# Patient Record
Sex: Male | Born: 1960 | Race: Black or African American | Hispanic: No | Marital: Single | Smoking: Current every day smoker
Health system: Southern US, Community
[De-identification: ages and names within clinical notes are randomized; demographics above are authoritative.]

## PROBLEM LIST (undated history)

## (undated) DIAGNOSIS — I10 Essential (primary) hypertension: Secondary | ICD-10-CM

## (undated) DIAGNOSIS — R519 Headache, unspecified: Secondary | ICD-10-CM

## (undated) DIAGNOSIS — R112 Nausea with vomiting, unspecified: Secondary | ICD-10-CM

## (undated) DIAGNOSIS — Z9889 Other specified postprocedural states: Secondary | ICD-10-CM

## (undated) DIAGNOSIS — Z8489 Family history of other specified conditions: Secondary | ICD-10-CM

## (undated) DIAGNOSIS — F410 Panic disorder [episodic paroxysmal anxiety] without agoraphobia: Secondary | ICD-10-CM

## (undated) DIAGNOSIS — M1990 Unspecified osteoarthritis, unspecified site: Secondary | ICD-10-CM

## (undated) HISTORY — PX: NO PAST SURGERIES: SHX2092

---

## 2008-04-22 ENCOUNTER — Emergency Department (HOSPITAL_COMMUNITY): Admission: EM | Admit: 2008-04-22 | Discharge: 2008-04-22 | Payer: Self-pay | Admitting: Emergency Medicine

## 2009-01-07 ENCOUNTER — Emergency Department (HOSPITAL_COMMUNITY): Admission: EM | Admit: 2009-01-07 | Discharge: 2009-01-08 | Payer: Self-pay | Admitting: Emergency Medicine

## 2009-10-18 ENCOUNTER — Emergency Department (HOSPITAL_COMMUNITY): Admission: EM | Admit: 2009-10-18 | Discharge: 2009-10-18 | Payer: Self-pay | Admitting: Emergency Medicine

## 2019-10-30 ENCOUNTER — Encounter (HOSPITAL_COMMUNITY): Payer: Self-pay

## 2019-10-30 ENCOUNTER — Other Ambulatory Visit: Payer: Self-pay

## 2019-10-30 ENCOUNTER — Ambulatory Visit (HOSPITAL_COMMUNITY)
Admission: EM | Admit: 2019-10-30 | Discharge: 2019-10-30 | Disposition: A | Discharge status: Home / Self Care | Payer: 59

## 2019-10-30 DIAGNOSIS — J36 Peritonsillar abscess: Secondary | ICD-10-CM

## 2019-10-30 DIAGNOSIS — J029 Acute pharyngitis, unspecified: Secondary | ICD-10-CM

## 2019-10-30 LAB — POC SARS CORONAVIRUS 2 AG -  ED
SARS Coronavirus 2 Ag: NEGATIVE
SARS Coronavirus 2 Ag: NEGATIVE

## 2019-10-30 LAB — POC SARS CORONAVIRUS 2 AG: SARS Coronavirus 2 Ag: NEGATIVE

## 2019-10-30 MED ORDER — LIDOCAINE HCL (PF) 1 % IJ SOLN
INTRAMUSCULAR | Status: AC
  Filled 2019-10-30: qty 2

## 2019-10-30 MED ORDER — KETOROLAC TROMETHAMINE 30 MG/ML IJ SOLN
30.0000 mg | Freq: Once | INTRAMUSCULAR | Status: AC
  Administered 2019-10-30: 30 mg via INTRAMUSCULAR

## 2019-10-30 MED ORDER — DEXAMETHASONE SODIUM PHOSPHATE 10 MG/ML IJ SOLN
10.0000 mg | Freq: Once | INTRAMUSCULAR | Status: AC
  Administered 2019-10-30: 15:00:00 10 mg via INTRAMUSCULAR

## 2019-10-30 MED ORDER — DEXAMETHASONE SODIUM PHOSPHATE 10 MG/ML IJ SOLN
INTRAMUSCULAR | Status: AC
  Filled 2019-10-30: qty 1

## 2019-10-30 MED ORDER — KETOROLAC TROMETHAMINE 30 MG/ML IJ SOLN
INTRAMUSCULAR | Status: AC
  Filled 2019-10-30: qty 1

## 2019-10-30 MED ORDER — CEFTRIAXONE SODIUM 1 G IJ SOLR
INTRAMUSCULAR | Status: AC
  Filled 2019-10-30: qty 10

## 2019-10-30 MED ORDER — AMOXICILLIN-POT CLAVULANATE 875-125 MG PO TABS: 1.0000 | ORAL_TABLET | Freq: Two times a day (BID) | ORAL | 0 refills | Status: DC

## 2019-10-30 MED ORDER — CEFTRIAXONE SODIUM 1 G IJ SOLR
1.0000 g | Freq: Once | INTRAMUSCULAR | Status: AC
Start: 2019-10-30 — End: 2019-10-30
  Administered 2019-10-30: 1 g via INTRAMUSCULAR

## 2019-10-30 NOTE — ED Provider Notes (Signed)
[place]    CSN: [id number] Arrival date & time: 10/30/19  1235      History   Chief Complaint Chief Complaint  Patient presents with  . Sore Throat    HPI Javier Bates is a 59 y.o. male.   Patient is a 59 year old male with no significant past medical history.  He presents today complaining of sore throat, pain with swallowing and difficulty swallowing.  This has been constant and worsening over the past 3 days.  He has also had some chills, nausea and loss of appetite.  Reporting he has not eaten in 2 days and has been taking Advil, Tylenol which he believes is upsetting his stomach.  Denies any abdominal pain, vomiting or diarrhea.  No recorded fevers at home.  He is able to drink fluids without any difficulty.  He is maintaining his own secretions.  ROS per HPI      History reviewed. No pertinent past medical history.  There are no problems to display for this patient.   History reviewed. No pertinent surgical history.     Home Medications    Prior to Admission medications   Medication Sig Start Date End Date Taking? Authorizing Provider  ibuprofen (ADVIL) 100 MG/5ML suspension Take 600 mg by mouth every 4 (four) hours as needed.   Yes [provider]  naproxen sodium (ALEVE) 220 MG tablet Take 220 mg by mouth.   Yes [provider]  amoxicillin-clavulanate (AUGMENTIN) 875-125 MG tablet Take 1 tablet by mouth every 12 (twelve) hours. 10/30/19   Orvan July, NP    Family History Family History  Problem Relation Age of Onset  . Asthma Mother   . Hypertension Mother   . Asthma Father     Social History Social History   Tobacco Use  . Smoking status: Current Every Day Smoker    Packs/day: 1.00    Types: Cigarettes  . Smokeless tobacco: Never Used  Substance Use Topics  . Alcohol use: Yes  . Drug use: Yes    Types: Marijuana     Allergies   Patient has no known allergies.   Review of Systems Review of  Systems   Physical Exam Triage Vital Signs ED Triage Vitals  Enc Vitals Group     BP 10/30/19 1317 (!) 155/118     Pulse Rate 10/30/19 1317 (!) 112     Resp 10/30/19 1317 20     Temp 10/30/19 1317 97.7 F (36.5 C)     Temp Source 10/30/19 1317 Oral     SpO2 10/30/19 1317 98 %     Weight --      Height --      Head Circumference --      Peak Flow --      Pain Score 10/30/19 1314 9     Pain Loc --      Pain Edu? --      Excl. in [place]? --    No data found.  Updated Vital Signs BP (!) 155/118 (BP Location: Right Arm)   Pulse (!) 112   Temp 97.7 F (36.5 C) (Oral)   Resp 20   SpO2 98%   Visual Acuity Right Eye Distance:   Left Eye Distance:   Bilateral Distance:    Right Eye Near:   Left Eye Near:    Bilateral Near:     Physical Exam Vitals and nursing note reviewed.  Constitutional:      General: He is not in  acute distress.    Appearance: He is well-developed. He is not ill-appearing, toxic-appearing or diaphoretic.  HENT:     Head: Normocephalic and atraumatic.     Mouth/Throat:     Pharynx: Posterior oropharyngeal erythema present.     Tonsils: Tonsillar abscess present. 2+ on the right. 4+ on the left.     Comments: Uvula deviation to the right Muffled speech Maintaining secretions. Pulmonary:     Effort: Pulmonary effort is normal.  Lymphadenopathy:     Cervical: Cervical adenopathy present.  Skin:    General: Skin is warm and dry.  Neurological:     Mental Status: He is alert.  Psychiatric:        Mood and Affect: Mood normal.      UC Treatments / Results  Labs (all labs ordered are listed, but only abnormal results are displayed) Labs Reviewed  POC SARS CORONAVIRUS 2 AG -  ED  POC SARS CORONAVIRUS 2 AG  POC SARS CORONAVIRUS 2 AG -  ED    EKG   Radiology No results found.  Procedures Procedures (including critical care time)  Medications Ordered in UC Medications  dexamethasone (DECADRON) injection 10 mg (10 mg Intramuscular  Given 10/30/19 1431)  cefTRIAXone (ROCEPHIN) injection 1 g (1 g Intramuscular Given 10/30/19 1432)  ketorolac (TORADOL) 30 MG/ML injection 30 mg (30 mg Intramuscular Given 10/30/19 1432)    Initial Impression / Assessment and Plan / UC Course  I have reviewed the triage vital signs and the nursing notes.  Pertinent labs & imaging results that were available during my care of the patient were reviewed by me and considered in my medical decision making (see chart for details).     Peritonsillar abscess-treating with steroid injection, antibiotic and Toradol here for pain. Sending Augmentin to the pharmacy to continue with outpatient treatment. Recommended if symptoms are not improving  or worsening over the next 24 to 48 hours you need to go to the ER. Also gave contact for ear nose and throat specialist  Final Clinical Impressions(s) / UC Diagnoses   Final diagnoses:  Peritonsillar abscess     Discharge Instructions     Treating you for a peritonsillar abscess. Steroid injection, pain medication and antibiotic injection given here today in clinic  Sending more antibiotics to the pharmacy to start to take today. If your symptoms worsen over the next 24 to 48 hours despite treatment you need to go the ER.  Otherwise you can follow-up with ear nose and throat specialist as listed on your discharge instructions.     ED Prescriptions    Medication Sig Dispense Auth. Provider   amoxicillin-clavulanate (AUGMENTIN) 875-125 MG tablet Take 1 tablet by mouth every 12 (twelve) hours. 14 tablet Renell Coaxum A, NP     PDMP not reviewed this encounter.   Loura Halt A, NP 10/30/19 1452

## 2019-10-30 NOTE — ED Triage Notes (Signed)
Pt c/o sore throat, difficulty swallowing, chills, nausea, difficulty eating 2/2 sore throat, HA x3 days. States "throwing up phlegm". Denies abd pain, diarrhea. Taking ibuprofen, aleve, advil. Taking the NSAIDS every 2-3 hours since last night and did not know that ibuprofen and advil is same thing.

## 2019-10-30 NOTE — Discharge Instructions (Addendum)
Treating you for a peritonsillar abscess. Steroid injection, pain medication and antibiotic injection given here today in clinic  Sending more antibiotics to the pharmacy to start to take today. If your symptoms worsen over the next 24 to 48 hours despite treatment you need to go the ER.  Otherwise you can follow-up with ear nose and throat specialist as listed on your discharge instructions.

## 2020-04-15 ENCOUNTER — Ambulatory Visit: Payer: 59 | Admitting: Family Medicine

## 2020-05-16 ENCOUNTER — Ambulatory Visit: Payer: 59 | Admitting: Family Medicine

## 2021-11-03 ENCOUNTER — Other Ambulatory Visit: Payer: Self-pay | Admitting: Family Medicine

## 2021-11-05 ENCOUNTER — Other Ambulatory Visit: Payer: Self-pay | Admitting: Family Medicine

## 2021-11-05 DIAGNOSIS — R221 Localized swelling, mass and lump, neck: Secondary | ICD-10-CM

## 2021-12-09 ENCOUNTER — Other Ambulatory Visit: Payer: Self-pay | Admitting: Family Medicine

## 2021-12-09 DIAGNOSIS — R519 Headache, unspecified: Secondary | ICD-10-CM

## 2021-12-09 DIAGNOSIS — R221 Localized swelling, mass and lump, neck: Secondary | ICD-10-CM

## 2021-12-15 ENCOUNTER — Other Ambulatory Visit: Payer: Self-pay

## 2021-12-15 ENCOUNTER — Ambulatory Visit: Payer: Managed Care, Other (non HMO)

## 2021-12-21 ENCOUNTER — Ambulatory Visit (INDEPENDENT_AMBULATORY_CARE_PROVIDER_SITE_OTHER): Payer: Managed Care, Other (non HMO)

## 2021-12-21 ENCOUNTER — Other Ambulatory Visit: Payer: Self-pay

## 2021-12-21 DIAGNOSIS — R221 Localized swelling, mass and lump, neck: Secondary | ICD-10-CM

## 2021-12-21 DIAGNOSIS — R519 Headache, unspecified: Secondary | ICD-10-CM | POA: Diagnosis not present

## 2021-12-21 MED ORDER — GADOBUTROL 1 MMOL/ML IV SOLN
7.5000 mL | Freq: Once | INTRAVENOUS | Status: AC | PRN
  Administered 2021-12-21: 7.5 mL via INTRAVENOUS

## 2022-01-15 ENCOUNTER — Other Ambulatory Visit: Payer: Self-pay | Admitting: Otolaryngology

## 2022-02-02 ENCOUNTER — Encounter (HOSPITAL_COMMUNITY): Payer: Self-pay | Admitting: Otolaryngology

## 2022-02-02 ENCOUNTER — Other Ambulatory Visit: Payer: Self-pay

## 2022-02-02 NOTE — Progress Notes (Signed)
Mr.Friedl denies chest pain or shortness of breath. Patient denies having any s/s of Covid in his household.  Patient denies any known exposure to Covid.  ? ?PCP is Dr.Eli   with [facility].  Mr. Bearman has a history of HTN, Mr. Velez states he was on blood pressure medication , but he no longer has any. I sent a fax to [facility], re questioning last office notes EKG tracing, and labs.  ?I instructed  Mr Hires  to shower with antibacteria soap. No nail polish, artificial or acrylic nails. Wear clean clothes, brush your teeth.DO not apply lotion, powers, cologne or deodorant.Glasses, contact lens,dentures or partials may not be worn in the OR. If you need to wear them, please bring a case for glasses, do not wear contacts or bring a case, the hospital does not have contact cases, dentures or partials will have to be removed , make sure they are clean, we will provide a denture cup to put them in. You will need some one to drive you home and a responsible person over the age of 10 to stay with you for the first 24 hours after surgery.  ?

## 2022-02-03 ENCOUNTER — Other Ambulatory Visit (HOSPITAL_COMMUNITY): Payer: Self-pay

## 2022-02-03 ENCOUNTER — Other Ambulatory Visit: Payer: Self-pay

## 2022-02-03 ENCOUNTER — Encounter (HOSPITAL_COMMUNITY)
Admission: RE | Disposition: A | Discharge status: Home / Self Care | Payer: Self-pay | Source: Home / Self Care | Attending: Otolaryngology

## 2022-02-03 ENCOUNTER — Ambulatory Visit (HOSPITAL_COMMUNITY): Payer: Commercial Managed Care - HMO | Admitting: Certified Registered Nurse Anesthetist

## 2022-02-03 ENCOUNTER — Encounter (HOSPITAL_COMMUNITY): Payer: Self-pay | Admitting: Otolaryngology

## 2022-02-03 ENCOUNTER — Ambulatory Visit (HOSPITAL_COMMUNITY)
Admission: RE | Admit: 2022-02-03 | Discharge: 2022-02-03 | Disposition: A | Discharge status: Home / Self Care | Payer: Commercial Managed Care - HMO | Attending: Otolaryngology | Admitting: Otolaryngology

## 2022-02-03 ENCOUNTER — Ambulatory Visit (HOSPITAL_BASED_OUTPATIENT_CLINIC_OR_DEPARTMENT_OTHER): Payer: Commercial Managed Care - HMO | Admitting: Certified Registered Nurse Anesthetist

## 2022-02-03 DIAGNOSIS — I10 Essential (primary) hypertension: Secondary | ICD-10-CM | POA: Insufficient documentation

## 2022-02-03 DIAGNOSIS — C07 Malignant neoplasm of parotid gland: Secondary | ICD-10-CM | POA: Diagnosis not present

## 2022-02-03 DIAGNOSIS — M1990 Unspecified osteoarthritis, unspecified site: Secondary | ICD-10-CM | POA: Diagnosis not present

## 2022-02-03 DIAGNOSIS — D37030 Neoplasm of uncertain behavior of the parotid salivary glands: Secondary | ICD-10-CM | POA: Diagnosis present

## 2022-02-03 DIAGNOSIS — D490 Neoplasm of unspecified behavior of digestive system: Secondary | ICD-10-CM | POA: Diagnosis not present

## 2022-02-03 DIAGNOSIS — F17210 Nicotine dependence, cigarettes, uncomplicated: Secondary | ICD-10-CM | POA: Insufficient documentation

## 2022-02-03 HISTORY — DX: Essential (primary) hypertension: I10

## 2022-02-03 HISTORY — DX: Nausea with vomiting, unspecified: Z98.890

## 2022-02-03 HISTORY — PX: PAROTIDECTOMY: SHX2163

## 2022-02-03 HISTORY — DX: Panic disorder (episodic paroxysmal anxiety): F41.0

## 2022-02-03 HISTORY — DX: Other specified postprocedural states: R11.2

## 2022-02-03 HISTORY — DX: Headache, unspecified: R51.9

## 2022-02-03 HISTORY — DX: Family history of other specified conditions: Z84.89

## 2022-02-03 HISTORY — DX: Unspecified osteoarthritis, unspecified site: M19.90

## 2022-02-03 LAB — BASIC METABOLIC PANEL
Anion gap: 7 (ref 5–15)
BUN: 14 mg/dL (ref 6–20)
CO2: 25 mmol/L (ref 22–32)
Calcium: 9 mg/dL (ref 8.9–10.3)
Chloride: 107 mmol/L (ref 98–111)
Creatinine, Ser: 0.97 mg/dL (ref 0.61–1.24)
GFR, Estimated: >60 mL/min (ref >60)
Glucose, Bld: 113 mg/dL — ABNORMAL HIGH (ref 70–99)
Potassium: 3.4 mmol/L — ABNORMAL LOW (ref 3.5–5.1)
Sodium: 139 mmol/L (ref 135–145)

## 2022-02-03 LAB — CBC
HCT: 43.2 % (ref 39.0–52.0)
Hemoglobin: 14.8 g/dL (ref 13.0–17.0)
MCH: 32 pg (ref 26.0–34.0)
MCHC: 34.3 g/dL (ref 30.0–36.0)
MCV: 93.5 fL (ref 80.0–100.0)
Platelets: 176 10*3/uL (ref 150–400)
RBC: 4.62 MIL/uL (ref 4.22–5.81)
RDW: 13.4 % (ref 11.5–15.5)
WBC: 3.8 10*3/uL — ABNORMAL LOW (ref 4.0–10.5)
nRBC: 0 % (ref 0.0–0.2)

## 2022-02-03 SURGERY — EXCISION, PAROTID GLAND
Anesthesia: General | Site: Face | Laterality: Left

## 2022-02-03 MED ORDER — ACETAMINOPHEN 10 MG/ML IV SOLN
INTRAVENOUS | Status: DC | PRN
  Administered 2022-02-03: 1000 mg via INTRAVENOUS

## 2022-02-03 MED ORDER — CEFAZOLIN SODIUM-DEXTROSE 2-4 GM/100ML-% IV SOLN
2.0000 g | INTRAVENOUS | Status: AC
  Administered 2022-02-03 (×2): 2 g via INTRAVENOUS
  Filled 2022-02-03: qty 100

## 2022-02-03 MED ORDER — SUCCINYLCHOLINE CHLORIDE 200 MG/10ML IV SOSY
PREFILLED_SYRINGE | INTRAVENOUS | Status: AC
  Filled 2022-02-03: qty 10

## 2022-02-03 MED ORDER — HYDRALAZINE HCL 20 MG/ML IJ SOLN
INTRAMUSCULAR | Status: AC
  Administered 2022-02-03: 5 mg via INTRAVENOUS
  Filled 2022-02-03: qty 1

## 2022-02-03 MED ORDER — PHENYLEPHRINE HCL-NACL 20-0.9 MG/250ML-% IV SOLN
INTRAVENOUS | Status: DC | PRN
  Administered 2022-02-03: 25 ug/min via INTRAVENOUS

## 2022-02-03 MED ORDER — OXYCODONE HCL 5 MG PO TABS
ORAL_TABLET | ORAL | Status: AC
  Filled 2022-02-03: qty 1

## 2022-02-03 MED ORDER — ORAL CARE MOUTH RINSE: 15.0000 mL | Freq: Once | OROMUCOSAL | Status: AC

## 2022-02-03 MED ORDER — SCOPOLAMINE 1 MG/3DAYS TD PT72
MEDICATED_PATCH | TRANSDERMAL | Status: DC | PRN
  Administered 2022-02-03: 1 via TRANSDERMAL

## 2022-02-03 MED ORDER — DEXMEDETOMIDINE (PRECEDEX) IN NS 20 MCG/5ML (4 MCG/ML) IV SYRINGE
PREFILLED_SYRINGE | INTRAVENOUS | Status: DC | PRN
  Administered 2022-02-03 (×2): 8 ug via INTRAVENOUS
  Administered 2022-02-03: 4 ug via INTRAVENOUS

## 2022-02-03 MED ORDER — DEXAMETHASONE SODIUM PHOSPHATE 10 MG/ML IJ SOLN
INTRAMUSCULAR | Status: DC | PRN
  Administered 2022-02-03: 10 mg via INTRAVENOUS

## 2022-02-03 MED ORDER — FENTANYL CITRATE (PF) 250 MCG/5ML IJ SOLN
INTRAMUSCULAR | Status: AC
  Filled 2022-02-03: qty 5

## 2022-02-03 MED ORDER — HYDROCODONE-ACETAMINOPHEN 5-325 MG PO TABS
1.0000 | ORAL_TABLET | Freq: Four times a day (QID) | ORAL | 0 refills | Status: AC | PRN
Start: 2022-02-03 — End: 2022-02-08
  Filled 2022-02-03: qty 20, 5d supply, fill #0

## 2022-02-03 MED ORDER — HEMOSTATIC AGENTS (NO CHARGE) OPTIME
TOPICAL | Status: DC | PRN
  Administered 2022-02-03: 1

## 2022-02-03 MED ORDER — LIDOCAINE 2% (20 MG/ML) 5 ML SYRINGE
INTRAMUSCULAR | Status: AC
  Filled 2022-02-03: qty 5

## 2022-02-03 MED ORDER — LIDOCAINE 2% (20 MG/ML) 5 ML SYRINGE
INTRAMUSCULAR | Status: DC | PRN
Start: 2022-02-03 — End: 2022-02-03
  Administered 2022-02-03: 100 mg via INTRAVENOUS

## 2022-02-03 MED ORDER — LIDOCAINE-EPINEPHRINE 1 %-1:100000 IJ SOLN
INTRAMUSCULAR | Status: AC
  Filled 2022-02-03: qty 1

## 2022-02-03 MED ORDER — 0.9 % SODIUM CHLORIDE (POUR BTL) OPTIME
TOPICAL | Status: DC | PRN
  Administered 2022-02-03: 1000 mL

## 2022-02-03 MED ORDER — OXYCODONE HCL 5 MG PO TABS
5.0000 mg | ORAL_TABLET | Freq: Once | ORAL | Status: AC
  Administered 2022-02-03: 5 mg via ORAL

## 2022-02-03 MED ORDER — PHENYLEPHRINE 80 MCG/ML (10ML) SYRINGE FOR IV PUSH (FOR BLOOD PRESSURE SUPPORT)
PREFILLED_SYRINGE | INTRAVENOUS | Status: DC | PRN
  Administered 2022-02-03: 80 ug via INTRAVENOUS
  Administered 2022-02-03: 40 ug via INTRAVENOUS
  Administered 2022-02-03 (×6): 80 ug via INTRAVENOUS

## 2022-02-03 MED ORDER — PROPOFOL 10 MG/ML IV BOLUS
INTRAVENOUS | Status: AC
  Filled 2022-02-03: qty 20

## 2022-02-03 MED ORDER — EPINEPHRINE HCL (NASAL) 0.1 % NA SOLN
NASAL | Status: AC
  Filled 2022-02-03: qty 30

## 2022-02-03 MED ORDER — PROPOFOL 10 MG/ML IV BOLUS
INTRAVENOUS | Status: DC | PRN
  Administered 2022-02-03: 50 mg via INTRAVENOUS
  Administered 2022-02-03: 150 mg via INTRAVENOUS

## 2022-02-03 MED ORDER — DEXAMETHASONE SODIUM PHOSPHATE 10 MG/ML IJ SOLN
INTRAMUSCULAR | Status: AC
  Filled 2022-02-03: qty 1

## 2022-02-03 MED ORDER — AMISULPRIDE (ANTIEMETIC) 5 MG/2ML IV SOLN: 10.0000 mg | Freq: Once | INTRAVENOUS | Status: AC

## 2022-02-03 MED ORDER — MIDAZOLAM HCL 2 MG/2ML IJ SOLN
INTRAMUSCULAR | Status: AC
  Filled 2022-02-03: qty 2

## 2022-02-03 MED ORDER — LACTATED RINGERS IV SOLN: INTRAVENOUS | Status: DC

## 2022-02-03 MED ORDER — MIDAZOLAM HCL 2 MG/2ML IJ SOLN
INTRAMUSCULAR | Status: DC | PRN
  Administered 2022-02-03: 2 mg via INTRAVENOUS

## 2022-02-03 MED ORDER — ONDANSETRON HCL 4 MG/2ML IJ SOLN
INTRAMUSCULAR | Status: DC | PRN
  Administered 2022-02-03: 4 mg via INTRAVENOUS

## 2022-02-03 MED ORDER — CHLORHEXIDINE GLUCONATE 0.12 % MT SOLN
15.0000 mL | Freq: Once | OROMUCOSAL | Status: AC
  Administered 2022-02-03: 15 mL via OROMUCOSAL
  Filled 2022-02-03: qty 15

## 2022-02-03 MED ORDER — HYDRALAZINE HCL 20 MG/ML IJ SOLN: 5.0000 mg | Freq: Once | INTRAMUSCULAR | Status: AC

## 2022-02-03 MED ORDER — AMISULPRIDE (ANTIEMETIC) 5 MG/2ML IV SOLN
INTRAVENOUS | Status: AC
  Administered 2022-02-03: 10 mg via INTRAVENOUS
  Filled 2022-02-03: qty 4

## 2022-02-03 MED ORDER — DSS 100 MG PO CAPS
100.0000 mg | ORAL_CAPSULE | Freq: Two times a day (BID) | ORAL | 0 refills | Status: AC | PRN
  Filled 2022-02-03: qty 12, 6d supply, fill #0

## 2022-02-03 MED ORDER — BACITRACIN ZINC 500 UNIT/GM EX OINT
TOPICAL_OINTMENT | CUTANEOUS | Status: AC
  Filled 2022-02-03: qty 28.35

## 2022-02-03 MED ORDER — BACITRACIN ZINC 500 UNIT/GM EX OINT
TOPICAL_OINTMENT | CUTANEOUS | Status: DC | PRN
Start: 2022-02-03 — End: 2022-02-03
  Administered 2022-02-03: 1 via TOPICAL

## 2022-02-03 MED ORDER — FENTANYL CITRATE (PF) 100 MCG/2ML IJ SOLN
INTRAMUSCULAR | Status: AC
  Filled 2022-02-03: qty 2

## 2022-02-03 MED ORDER — ACETAMINOPHEN 10 MG/ML IV SOLN
INTRAVENOUS | Status: AC
  Filled 2022-02-03: qty 100

## 2022-02-03 MED ORDER — SCOPOLAMINE 1 MG/3DAYS TD PT72
MEDICATED_PATCH | TRANSDERMAL | Status: AC
  Filled 2022-02-03: qty 1

## 2022-02-03 MED ORDER — SUCCINYLCHOLINE CHLORIDE 200 MG/10ML IV SOSY
PREFILLED_SYRINGE | INTRAVENOUS | Status: DC | PRN
  Administered 2022-02-03: 140 mg via INTRAVENOUS

## 2022-02-03 MED ORDER — CEFAZOLIN SODIUM-DEXTROSE 2-4 GM/100ML-% IV SOLN
INTRAVENOUS | Status: AC
  Filled 2022-02-03: qty 100

## 2022-02-03 MED ORDER — ONDANSETRON HCL 4 MG/2ML IJ SOLN
INTRAMUSCULAR | Status: AC
  Filled 2022-02-03: qty 2

## 2022-02-03 MED ORDER — LIDOCAINE-EPINEPHRINE 1 %-1:100000 IJ SOLN
INTRAMUSCULAR | Status: DC | PRN
  Administered 2022-02-03: 10 mL

## 2022-02-03 MED ORDER — FENTANYL CITRATE (PF) 250 MCG/5ML IJ SOLN
INTRAMUSCULAR | Status: DC | PRN
Start: 2022-02-03 — End: 2022-02-03
  Administered 2022-02-03 (×8): 50 ug via INTRAVENOUS

## 2022-02-03 MED ORDER — FENTANYL CITRATE (PF) 100 MCG/2ML IJ SOLN
25.0000 ug | INTRAMUSCULAR | Status: DC | PRN
  Administered 2022-02-03: 50 ug via INTRAVENOUS

## 2022-02-03 SURGICAL SUPPLY — 51 items
BAG COUNTER SPONGE SURGICOUNT (BAG) ×2 IMPLANT
BIOPATCH BLUE 3/4IN DISK W/1.5 (GAUZE/BANDAGES/DRESSINGS) ×1 IMPLANT
BLADE CLIPPER SURG (BLADE) ×1 IMPLANT
BLADE SURG 15 STRL LF DISP TIS (BLADE) ×1 IMPLANT
BLADE SURG 15 STRL SS (BLADE) ×1
CABLE BIPOLOR RESECTION CORD (MISCELLANEOUS) ×2 IMPLANT
CANISTER SUCT 3000ML PPV (MISCELLANEOUS) ×2 IMPLANT
CLEANER TIP ELECTROSURG 2X2 (MISCELLANEOUS) ×2 IMPLANT
CNTNR URN SCR LID CUP LEK RST (MISCELLANEOUS) ×1 IMPLANT
CONT SPEC 4OZ STRL OR WHT (MISCELLANEOUS) ×1
COVER SURGICAL LIGHT HANDLE (MISCELLANEOUS) ×2 IMPLANT
DRAIN JACKSON RD 7FR 3/32 (WOUND CARE) ×2 IMPLANT
DRAPE POUCH INSTRU U-SHP 10X18 (DRAPES) ×1 IMPLANT
DRAPE SURG 17X23 STRL (DRAPES) ×2 IMPLANT
DRSG TEGADERM 2-3/8X2-3/4 SM (GAUZE/BANDAGES/DRESSINGS) ×6 IMPLANT
ELECT COATED BLADE 2.86 ST (ELECTRODE) ×2 IMPLANT
ELECT PAIRED SUBDERMAL (MISCELLANEOUS) ×2
ELECT REM PT RETURN 9FT ADLT (ELECTROSURGICAL) ×2
ELECTRODE PAIRED SUBDERMAL (MISCELLANEOUS) ×1 IMPLANT
ELECTRODE REM PT RTRN 9FT ADLT (ELECTROSURGICAL) ×1 IMPLANT
EVACUATOR SILICONE 100CC (DRAIN) ×1 IMPLANT
FORCEPS BIPOLAR SPETZLER 8 1.0 (NEUROSURGERY SUPPLIES) ×2 IMPLANT
GAUZE 4X4 16PLY ~~LOC~~+RFID DBL (SPONGE) ×2 IMPLANT
GLOVE BIOGEL M 7.0 STRL (GLOVE) ×5 IMPLANT
GOWN STRL REUS W/ TWL LRG LVL3 (GOWN DISPOSABLE) ×2 IMPLANT
GOWN STRL REUS W/TWL LRG LVL3 (GOWN DISPOSABLE) ×4
HEMOSTAT SURGICEL 2X4 FIBR (HEMOSTASIS) ×3 IMPLANT
KIT BASIN OR (CUSTOM PROCEDURE TRAY) ×2 IMPLANT
KIT TURNOVER KIT B (KITS) ×2 IMPLANT
NDL HYPO 25GX1X1/2 BEV (NEEDLE) ×1 IMPLANT
NEEDLE HYPO 25GX1X1/2 BEV (NEEDLE) ×2 IMPLANT
NS IRRIG 1000ML POUR BTL (IV SOLUTION) ×2 IMPLANT
PAD ARMBOARD 7.5X6 YLW CONV (MISCELLANEOUS) ×4 IMPLANT
PATTIES SURGICAL .5 X.5 (GAUZE/BANDAGES/DRESSINGS) ×3 IMPLANT
PENCIL SMOKE EVACUATOR (MISCELLANEOUS) ×2 IMPLANT
PROBE NERVBE PRASS .33 (MISCELLANEOUS) ×2 IMPLANT
SET WALTER ACTIVATION W/DRAPE (SET/KITS/TRAYS/PACK) ×1 IMPLANT
SHEARS HARMONIC 9CM CVD (BLADE) ×2 IMPLANT
SPONGE INTESTINAL PEANUT (DISPOSABLE) ×2 IMPLANT
STAPLER VISISTAT 35W (STAPLE) ×2 IMPLANT
SUT ETHILON 2 0 FS 18 (SUTURE) ×1 IMPLANT
SUT ETHILON 4 0 PS 2 18 (SUTURE) ×3 IMPLANT
SUT PROLENE 5 0 PS 2 (SUTURE) ×2 IMPLANT
SUT PROLENE 6 0 P 3 18 (SUTURE) ×2 IMPLANT
SUT SILK 2 0 REEL (SUTURE) ×2 IMPLANT
SUT SILK 2 0 SH CR/8 (SUTURE) ×2 IMPLANT
SUT SILK 3 0 REEL (SUTURE) ×2 IMPLANT
SUT VIC AB 4-0 PS2 27 (SUTURE) ×2 IMPLANT
SUT VIC AB 5-0 P-3 18XBRD (SUTURE) ×1 IMPLANT
SUT VIC AB 5-0 P3 18 (SUTURE) ×1
TRAY ENT MC OR (CUSTOM PROCEDURE TRAY) ×2 IMPLANT

## 2022-02-03 NOTE — Op Note (Signed)
OPERATIVE NOTE ? ?Javier Bates Date/Time of Admission: 02/03/2022  6:34 AM  ?CSN: [id number] Attending Provider: Jason Coop, DO ?Room/Bed: MCPO/NONE DOB: 10/01/1960 Age: 61 y.o. ? ? 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 ?Procedure: ?Procedure(s): ?LEFT SUPERFICIAL PAROTIDECTOMY WITH FACIAL NERVE DISSECTION ? ?Anesthesia: ?General ? ?Surgeon(s): ?[place], DO ? ?Staff: ?Circulator: Delman Cheadle, RN ?Relief Circulator: Margy Clarks, RN ?Relief Scrub: Christ Kick ?Scrub Person: Romero Liner, CST; Celene Squibb, RN ?Vendor Representative : Roseanne Kaufman ?Circulator Assistant: Celene Squibb, RN ? ?Implants: ?* No implants in log * ? ?Specimens: ?ID Type Source Tests Collected by Time Destination  ?1 : Left Parotid mass Tissue PATH Other SURGICAL PATHOLOGY Kendi Defalco, [place], DO 02/03/2022 1121   ?2 : Left periparotid lymph node Tissue PATH Lymph node excision SURGICAL PATHOLOGY Kidus Delman A, DO 02/03/2022 1142   ? ? ?Complications: ?None ? ?EBL: ?25 ML ? ?Condition: ?stable ? ?Operative Findings:  ?3.5 cm mass lesion of left superficial parotid with associated small, normal appearing lymph nodes ? ?Description of Operation: ?Once operative consent was obtained and the site and surgery were confirmed with the patient and the operating room team, the patient was brought back to the operating room and general endotracheal anesthesia was obtained. He was rotated to 180 degrees. The patient was turned over to the ENT service. The NIM facial nerve monitor was attached to the left orbicularis oculi and orbicularis oris, it was noted to be in good working condition. Physical landmarks in the left neck were identified including the angle of the mandible, lower border of the mandible, the thyroid notch, the sternal notch, and sternocleidomastoid muscle. A modified Blair incision was planned on the left hand side and infiltrated  with 1% lidocaine with 1:100,000 epinephrine. The patient was prepped and draped in sterile fashion. Modified Blair incision was then made with a 15 blade. Superiorly, this incision was taken down in the subperichondrial plane, taken to the tragal cartilage until the tragal pointer was identified. Inferiorly, in continuing the dissection, the external jugular vein was identified as well as the greater auricular nerve. The greater auricular nerve was traced into the parotid gland and the main trunk and branch to the ear lobe was preserved. A subplatysmal flap was elevated anteriorly and posteriorly in the inferior portion of the incision, and a mid SMAS flap was elevated with Jones scissors anteriorly approximately 2 cm anterior to the location of the mass. This dissection was continued until it met with the superior dissection and that flap was elevated as well.  The anterior border of the sternocleidomastoid muscle was identified and followed until the posterior border of digastric muscle was identified. The posterior digastric muscle was followed to its attachment to the mastoid bone. With superior and inferior dissection plane clearly delineated, which estimated the depth and location of the facial nerve, the facial nerve was identified at the exit from the stylomastoid foramen and traced to the upper and lower division. The mass was excised from the superficial parotid lobe with care taken to dissect free each branch of the facial nerve with careful dissection from inferior to superior. The mass was freed from the facial nerve and excised with a margin of normal parotid tissue. The mass was well encapsulated and easily dissected free from the surrounding parotid tissue. The mass was sent for permanent section. A small normal appearing lymph node was also sent as a permanent specimen. Copious irrigation was placed into the wound  and hemostasis was obtained. Facial nerve branches were examined and tested with the  nerve monitor and intact. A size 7 fully fluted drain was placed into the incision posteriorly in the right neck. The wound was closed with deep interrupted 3-0 Vicryl sutures in the dermis and the skin was closed with a running 5-0 and 6-0 Prolene suture. Bacitracin was applied to the wound.  A drain stitch was secured to hold the drain. The drain was placed on bulb suction. The patient was then turned over to the anesthesia service. He was extubated in the operating room and sent to the PACU in stable condition. The patient will be discharged with drain in place this afternoon. ? ?Assistance was required throughout the surgical procedure including surgical planning, retraction, management of bleeding and surgical decision-making throughout the operation. ? ? ?[place], DO ?[facility]  ?02/03/2022   ? ?

## 2022-02-03 NOTE — Anesthesia Postprocedure Evaluation (Signed)
Anesthesia Post Note ? ?Patient: Javier Bates ? ?Procedure(s) Performed: PAROTIDECTOMY WITH FACIAL NERVE DISSECTION (Left: Face) ? ?  ? ?Patient location during evaluation: PACU ?Anesthesia Type: General ?Level of consciousness: awake ?Pain management: pain level controlled ?Vital Signs Assessment: post-procedure vital signs reviewed and stable ?Respiratory status: spontaneous breathing ?Cardiovascular status: stable ?Postop Assessment: no apparent nausea or vomiting ?Anesthetic complications: no ? ? ?No notable events documented. ? ?Last Vitals:  ?Vitals:  ? 02/03/22 1400 02/03/22 1415  ?BP: (!) 160/93 (!) 158/98  ?Pulse: 65 64  ?Resp: 14 14  ?Temp:    ?SpO2: 99% 99%  ?  ?Last Pain:  ?Vitals:  ? 02/03/22 1400  ?TempSrc:   ?PainSc: 0-No pain  ? ? ?  ?  ?  ?  ?  ?  ? ?[name] ? ? ? ? ?

## 2022-02-03 NOTE — Anesthesia Preprocedure Evaluation (Addendum)
Anesthesia Evaluation  ?Patient identified by MRN, date of birth, ID band ?Patient awake ? ? ? ?Reviewed: ?Allergy & Precautions, NPO status , Patient's Chart, lab work & pertinent test results ? ?History of Anesthesia Complications ?(+) PONV and history of anesthetic complications ? ?Airway ?Mallampati: II ? ?TM Distance: >3 FB ? ? ? ? Dental ?  ?Pulmonary ?Current Smoker and Patient abstained from smoking.,  ?  ?breath sounds clear to auscultation ? ? ? ? ? ? Cardiovascular ?hypertension,  ?Rhythm:Regular Rate:Normal ? ? ?  ?Neuro/Psych ? Headaches,   ? GI/Hepatic ?negative GI ROS, Neg liver ROS,   ?Endo/Other  ?negative endocrine ROS ? Renal/GU ?negative Renal ROS  ? ?  ?Musculoskeletal ? ?(+) Arthritis ,  ? Abdominal ?  ?Peds ? Hematology ?  ?Anesthesia Other Findings ? ? Reproductive/Obstetrics ? ?  ? ? ? ? ? ? ? ? ? ? ? ? ? ?  ?  ? ? ? ? ? ? ? ? ?Anesthesia Physical ?Anesthesia Plan ? ?ASA: 3 ? ?Anesthesia Plan: General  ? ?Post-op Pain Management:   ? ?Induction: Intravenous ? ?PONV Risk Score and Plan: 2 and Ondansetron, Dexamethasone and Midazolam ? ?Airway Management Planned: Oral ETT ? ?Additional Equipment:  ? ?Intra-op Plan:  ? ?Post-operative Plan: Extubation in OR ? ?Informed Consent: I have reviewed the patients History and Physical, chart, labs and discussed the procedure including the risks, benefits and alternatives for the proposed anesthesia with the patient or authorized representative who has indicated his/her understanding and acceptance.  ? ? ? ?Dental advisory given ? ?Plan Discussed with: CRNA and Anesthesiologist ? ?Anesthesia Plan Comments:   ? ? ? ? ? ? ?Anesthesia Quick Evaluation ? ?

## 2022-02-03 NOTE — Anesthesia Procedure Notes (Signed)
Procedure Name: Intubation ?Date/Time: 02/03/2022 8:42 AM ?Performed by: Michele Rockers, CRNA ?Pre-anesthesia Checklist: Patient identified, Patient being monitored, Timeout performed, Emergency Drugs available and Suction available ?Patient Re-evaluated:Patient Re-evaluated prior to induction ?Oxygen Delivery Method: Circle System Utilized ?Preoxygenation: Pre-oxygenation with 100% oxygen ?Induction Type: IV induction ?Ventilation: Mask ventilation without difficulty ?Laryngoscope Size: Sabra Heck and 2 ?Grade View: Grade I ?Tube type: Oral ?Tube size: 7.5 mm ?Number of attempts: 1 ?Airway Equipment and Method: Stylet ?Placement Confirmation: ETT inserted through vocal cords under direct vision, positive ETCO2 and breath sounds checked- equal and bilateral ?Secured at: 22 cm ?Tube secured with: Tape ?Dental Injury: Teeth and Oropharynx as per pre-operative assessment  ? ? ? ? ?

## 2022-02-03 NOTE — H&P (Signed)
Javier Bates is an 61 y.o. male.   ? ?Chief Complaint:  Left parotid neoplasm ? ?HPI: Patient presents today for planned elective procedure.  He denies any interval change in history since office visit on 12/30/2021: ? ?Javier Bates is a 61 y.o. male who presents as a new consult patient, referred by Collene Leyden, MD for evaluation and treatment of left parotid neoplasm. Patient states that he first noticed the mass several months ago. He has had work-up to include MRI of the neck with and without contrast, which demonstrated a 3.2 x 3.0 x 3.5 cm mass of the parotid tail. Patient denies any pain, swelling or other symptoms in association with the lesion. Patient is a current every day smoker. ? ?Past Medical History:  ?Diagnosis Date  ? Arthritis   ? Family history of adverse reaction to anesthesia   ? Headache   ? Hypertension   ? Panic attack   ? PONV (postoperative nausea and vomiting)   ? ? ?Past Surgical History:  ?Procedure Laterality Date  ? NO PAST SURGERIES    ? ? ?Family History  ?Problem Relation Age of Onset  ? Asthma Mother   ? Hypertension Mother   ? Asthma Father   ? ? ?Social History:  reports that he has been smoking cigarettes. He has been smoking an average of .33 packs per day. He has never used smokeless tobacco. He reports current alcohol use of about 2.0 standard drinks per week. He reports current drug use. Frequency: 7.00 times per week. Drug: Marijuana. ? ?Allergies: No Known Allergies ? ?Medications Prior to Admission  ?Medication Sig Dispense Refill  ? acetaminophen (TYLENOL) 500 MG tablet Take 1,000-1,500 mg by mouth every 6 (six) hours as needed for moderate pain.    ? ibuprofen (ADVIL) 200 MG tablet Take 400-600 mg by mouth every 8 (eight) hours as needed for moderate pain.    ? ? ?Results for orders placed or performed during the hospital encounter of 02/03/22 (from the past 48 hour(s))  ?CBC per protocol     Status: Abnormal  ? Collection Time: 02/03/22  7:39 AM  ?Result Value Ref Range   ? WBC 3.8 (L) 4.0 - 10.5 K/uL  ? RBC 4.62 4.22 - 5.81 MIL/uL  ? Hemoglobin 14.8 13.0 - 17.0 g/dL  ? HCT 43.2 39.0 - 52.0 %  ? MCV 93.5 80.0 - 100.0 fL  ? MCH 32.0 26.0 - 34.0 pg  ? MCHC 34.3 30.0 - 36.0 g/dL  ? RDW 13.4 11.5 - 15.5 %  ? Platelets 176 150 - 400 K/uL  ? nRBC 0.0 0.0 - 0.2 %  ?  Comment: Performed at [facility], [place] [address], [place], [place] [address]  ?Basic metabolic panel per protocol     Status: Abnormal  ? Collection Time: 02/03/22  7:39 AM  ?Result Value Ref Range  ? Sodium 139 135 - 145 mmol/L  ? Potassium 3.4 (L) 3.5 - 5.1 mmol/L  ? Chloride 107 98 - 111 mmol/L  ? CO2 25 22 - 32 mmol/L  ? Glucose, Bld 113 (H) 70 - 99 mg/dL  ?  Comment: Glucose reference range applies only to samples taken after fasting for at least 8 hours.  ? BUN 14 6 - 20 mg/dL  ? Creatinine, Ser 0.97 0.61 - 1.24 mg/dL  ? Calcium 9.0 8.9 - 10.3 mg/dL  ? GFR, Estimated >60 >60 mL/min  ?  Comment: (NOTE) ?Calculated using the CKD-EPI Creatinine Equation (2021) ?  ?  Anion gap 7 5 - 15  ?  Comment: Performed at [facility], [place] [address], [place], [place] [address]  ? ?No results found. ? ?ROS: ROS ? ?Blood pressure (!) 146/86, pulse (!) 55, temperature (!) 97.4 ?F (36.3 ?C), temperature source Oral, resp. rate 18, height 5' 8.5" (1.74 m), weight 79.4 kg, SpO2 98 %. ? ?PHYSICAL EXAM: ?Physical Exam ?Constitutional:   ?   Appearance: Normal appearance.  ?HENT:  ?   Right Ear: External ear normal.  ?   Left Ear: External ear normal.  ?   Nose: Nose normal.  ?   Mouth/Throat:  ?   Mouth: Mucous membranes are dry.  ?Skin: ?   General: Skin is warm and dry.  ?Neurological:  ?   General: No focal deficit present.  ?   Mental Status: He is alert and oriented to person, place, and time.  ?Psychiatric:     ?   Mood and Affect: Mood normal.     ?   Behavior: Behavior normal.  ? ? ?Studies Reviewed: MRI neck reviewed ? ? ?Assessment/Plan ?Javier Bates is a 61 y.o. 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 Patient has had MRI of the neck with and without contrast for further evaluation, which demonstrated a 3.2 x 3.0 x 3.5 cm mass of the parotid tail. 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  ?-To OR today for left superficial parotidectomy. Risks of surgery, including peri-incisional and ear numbness, soft tissue depression from removal of parotid and tumor, facial nerve weakness, both temporary and permanent, bleeding, infection, salivary fistula, Frey syndrome, and first bite syndrome were reviewed with the patient. We also reviewed expected postoperative course and recovery, including need for drain to remain in place for several days postoperatively. Patient expressed understanding and agreement. All questions answered. ? ? ? ?Eithel Ryall A [place] ?02/03/2022, 8:29 AM ? ? ? ?

## 2022-02-03 NOTE — Discharge Instructions (Signed)
PAROTID SURGERY [facility] Post Operative Instructions  The Surgery Itself Parotid surgery involves general anesthesia, typically for 2-3 hours. Patients may  be quite sedated for several hours after surgery and may remain sleepy for much  of the day. Nausea and vomiting are occasionally seen, and usually resolve by  the evening of surgery - even without additional medications. Some patients stay overnight in the hospital; other patients can go home the evening of surgery.  Most patients will have a drain in place after surgery, which is usually removed in the office 2-4 days after surgery.   Your Incision Your incision is closed with sutures, there may be tape or skin glue over your  incision. Do not remove this tape or the glue. You can shower and wash your hair  as usual starting 24 hours after your drain is removed. You may wash in a  bathtub prior to that time if you are careful not to get your neck wet. Use a dab of  Bacitracin ointment on your drain site (under and behind your ear) before and  after showering. It is normal to have some red or pink drainage from your drain  exit site for 1-2 days after the drain is removed. Do not soak or scrub the incision.  You might notice swelling and bruising around your incision, upper neck and face  after surgery. In addition, the scar may become pink and hard. This hardening  will peak at about 6 weeks and may result in some tightness, which will  disappear over the next 2 to 3 months. You will have numbness of the skin  around the incision and the lower ear on the side of surgery. This will resolve  slowly after surgery except on the earlobe, where some patients have permanent  numbness. Be very careful when shaving if your neck skin is numb. You should  apply sunscreen on your incision site starting 1 month after surgery EVERY day  for the first year after surgery. This will prevent a red or pink scar and give you  the best cosmetic  result for your scar. A daily moisturizer with sunscreen  (example Oil of Olay with SPF 15) is fine.   Limitations You can start resuming normal activities as tolerated 7 days after surgery. For  some patients, lifting can cause pain and stretching at the surgery site for up to 3  weeks after surgery. You should not drive or drink alcohol while taking pain  medications. Most people can return to work/school 1-2 weeks after surgery, but there may be physical limitations as far as what you may do while at work. Your  surgeon will review your specific limitations and release you when you are ready  to return to work.   Medications ? Pain medication can be used for pain as prescribed. Pain is expected after  surgery. Your neck and face will be sore and pain will be worse when the  neck is stretched and moved. As the surgical site heals, pain will resolve  over the course of a week. It is not uncommon for pain to get worse when  you first go home because your activity may increase but from that point  on the pain should improve every day. Pain medications can cause  nausea, which can be prevented if you take them with food or milk. ? You may be given a stool softener (Colace) because pain medications may  make you constipated. It is recommended that you use   these starting right  after surgery -- you may discontinue if you find that you are having normal  or loose stools. ? Bacitracin ointment should be applied to the drain exit site three times a  day for 2 days after the drain is removed. It should also be applied before  and after showering for 24 hours after the drain is removed. Bacitracin  ointment can also be applied to the incision once the tape is removed or  the glue peels off. This prevents scabbing and itching. ? Take all of your routine medications as prescribed, unless told otherwise  by your surgeon. Any medications that thin the blood should be avoided.  ? IT IS OK TO TAKE  OVER THE COUNTER PAIN MEDICATION  (IBUPROFEN, NAPROXEN, or ACETAMINOPHEN) IN ADDITION TO  YOUR PRESCRIBED MEDICATIONS. DO NOT TAKE ASPIRIN UNLESS  CLEARED WITH YOUR SURGEON.  ? Limit Acetaminophen/Tylenol to less than 4,000mg/day  ? Limit Ibuprofen/Motrin to less than 3,600mg/day  Pain ? The main complaint following parotid surgery is pain with eating,  swallowing and neck movement. Some people experience a dull ache,  while others feel a sharp pain. This should not keep you from eating  anything you want and will improve daily after surgery. When the sensation  returns to the skin of the neck and ear, there may be feelings like electric  shocks from the sensory nerves returning to normal function. The nerve  that controls movement of the facial muscles is exposed during parotid  surgery. Because of this, some patients have weakness in those muscles  after surgery from swelling which will resolve with time. This may make the face feel "heavy" or "sluggish."  Cough If your operation was done under general anesthesia, you may feel like you have  phlegm in your throat or a sore throat. This is usually because there was a tube  in your windpipe while you were asleep that caused irritation that you perceive as  phlegm. You will notice that if you cough, very little phlegm will come up. This  should clear up in 4 to 5 days.  Reasons to call your surgeon's office ? Persistent fever over 101 F ? Bleeding from the neck incision ? Increasing facial or neck swelling ? Sudden loss of facial movement ? Pain that is not relieved by your medications ? Purulent drainage (pus) from the incision ? Redness surrounding the incision that is worsening or getting bigge  

## 2022-02-03 NOTE — Transfer of Care (Signed)
Immediate Anesthesia Transfer of Care Note ? ?Patient: Javier Bates ? ?Procedure(s) Performed: PAROTIDECTOMY WITH FACIAL NERVE DISSECTION (Left: Face) ? ?Patient Location: PACU ? ?Anesthesia Type:General ? ?Level of Consciousness: drowsy, patient cooperative and responds to stimulation ? ?Airway & Oxygen Therapy: Patient Spontanous Breathing and Patient connected to nasal cannula oxygen ? ?Post-op Assessment: Report given to RN, Post -op Vital signs reviewed and stable and Patient moving all extremities X 4 ? ?Post vital signs: Reviewed and stable ? ?Last Vitals:  ?Vitals Value Taken Time  ?BP 174/99 02/03/22 1300  ?Temp    ?Pulse 73 02/03/22 1308  ?Resp 16 02/03/22 1308  ?SpO2 99 % 02/03/22 1308  ?Vitals shown include unvalidated device data. ? ?Last Pain:  ?Vitals:  ? 02/03/22 0717  ?TempSrc:   ?PainSc: 0-No pain  ?   ? ?  ? ?Complications: No notable events documented. ?

## 2022-02-04 ENCOUNTER — Encounter (HOSPITAL_COMMUNITY): Payer: Self-pay | Admitting: Otolaryngology

## 2022-02-08 LAB — SURGICAL PATHOLOGY

## 2023-09-04 IMAGING — MR MR HEAD WO/W CM
14 series · 43 of 48 positions shown · IV contrast (gadavist)
Comparison: CT head 10/19/2019.

CLINICAL DATA: Left neck mass

EXAM:
MRI HEAD WITHOUT AND WITH CONTRAST
TECHNIQUE: Multiplanar, multiecho pulse sequences of the brain and surrounding
structures were obtained without and with intravenous contrast.
CONTRAST:  7.5mL GADAVIST GADOBUTROL 1 MMOL/ML IV SOLN
Patient had nausea vomiting after contrast infusion.

[Series 2: DWI · axial · 3.0mm · 1.46mm/px · z∈[-58,+103]mm · 5 of 110 slices shown (1 of 4)]
[im 1/110]
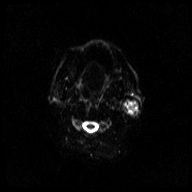
[im 28/110]
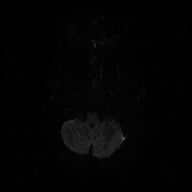
[im 55/110]
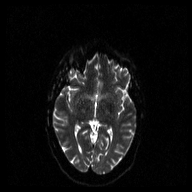
[im 82/110]
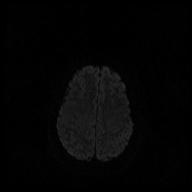
[im 110/110]
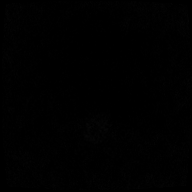

[Series 3: DWI · axial · 3.0mm · 1.46mm/px · z∈[-58,+103]mm · 3 of 55 slices shown (2 of 4)]
[im 1/55]
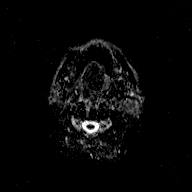
[im 28/55]
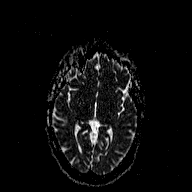
[im 55/55]
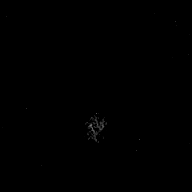

[Series 4: DWI · coronal · 5.0mm · 1.46mm/px · 3 of 63 slices shown (3 of 4)]
[im 1/63]
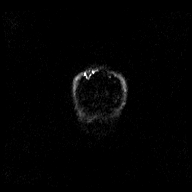
[im 32/63]
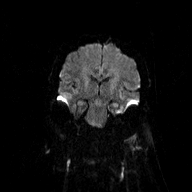
[im 63/63]
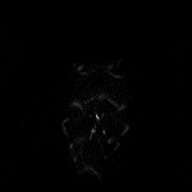

[Series 5: DWI · coronal · 5.0mm · 1.46mm/px · 2 of 32 slices shown (4 of 4)]
[im 1/32]
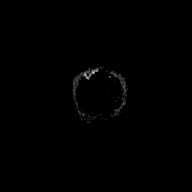
[im 32/32]
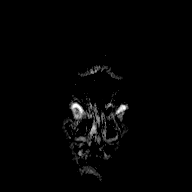

[Series 6: T1 · sagittal · 5.0mm · 0.45mm/px · 1 of 23 slices shown (1 of 2)]
[im 1/23]
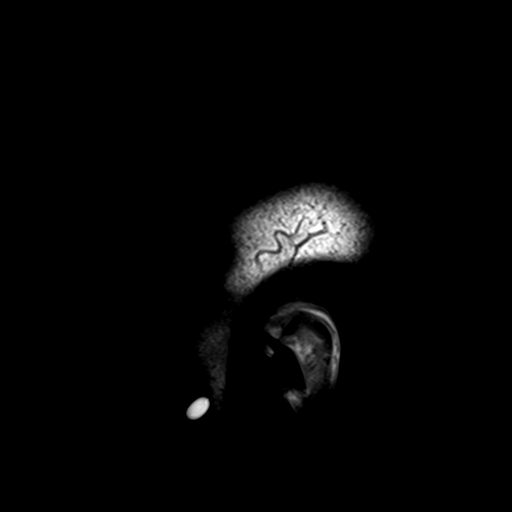

[Series 7: T2 · axial · 5.0mm · 0.72mm/px · 1 of 24 slices shown (1 of 2)]
[im 1/24]
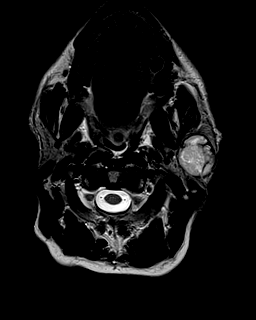

[Series 8: FLAIR · axial · 3.0mm · 0.45mm/px · z∈[-65,+97]mm · 3 of 55 slices shown (1 of 2)]
[im 1/55]
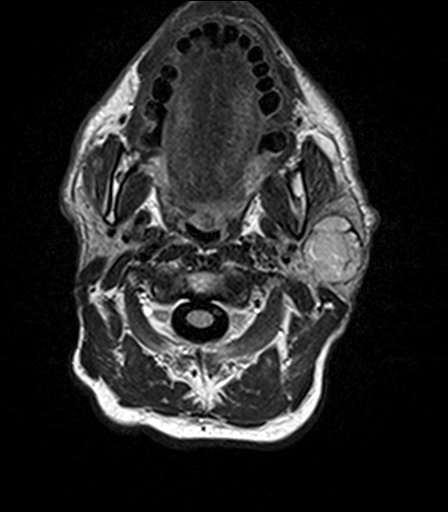
[im 28/55]
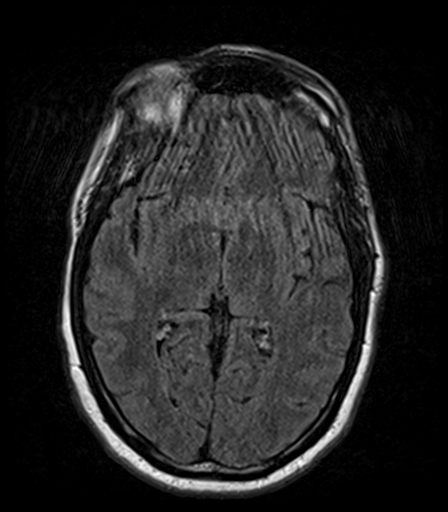
[im 55/55]
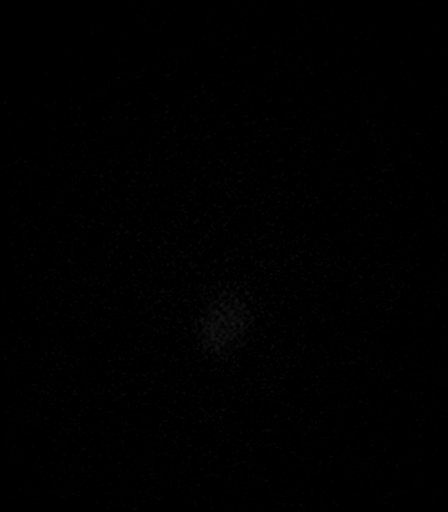

[Series 9: T2 · axial · 5.0mm · 0.72mm/px · 1 of 24 slices shown (2 of 2)]
[im 1/24]
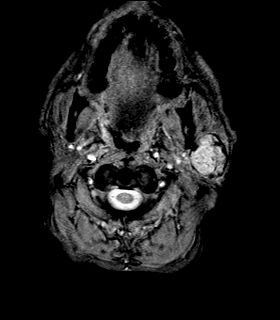

[Series 10: T1 · axial · 1.0mm · 0.94mm/px · z∈[-63,-18]mm · 3 of 159 slices shown (2 of 2)]
[im 1/159]
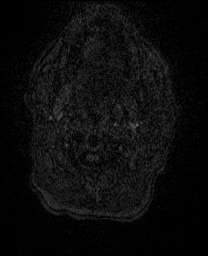
[im 23/159]
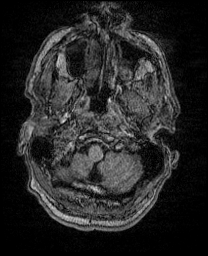
[im 46/159]
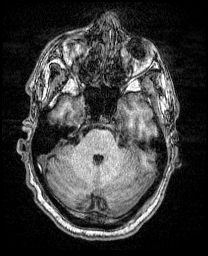

[Series 11: FLAIR · axial · 3.0mm · 0.45mm/px · z∈[-65,+97]mm · 3 of 55 slices shown (2 of 2)]
[im 1/55]
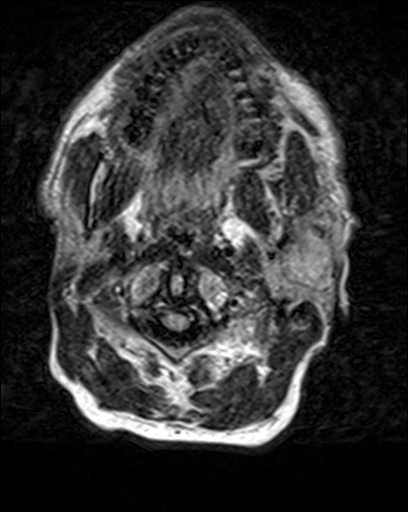
[im 28/55]
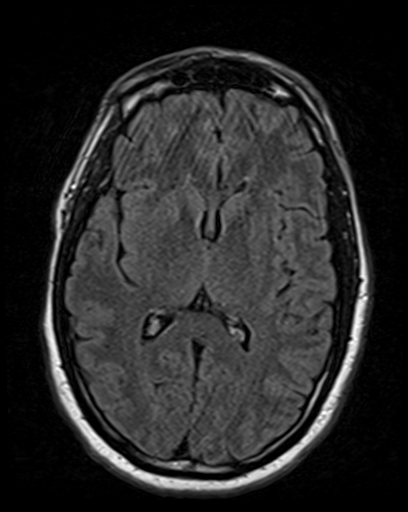
[im 55/55]
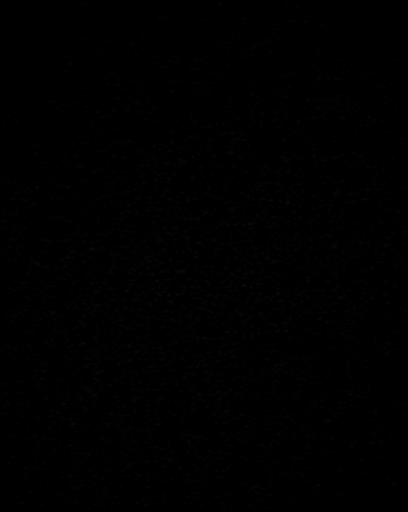

[Series 13: T2 post-contrast · coronal · 5.0mm · 0.43mm/px · 1 of 30 slices shown]
[im 1/30]
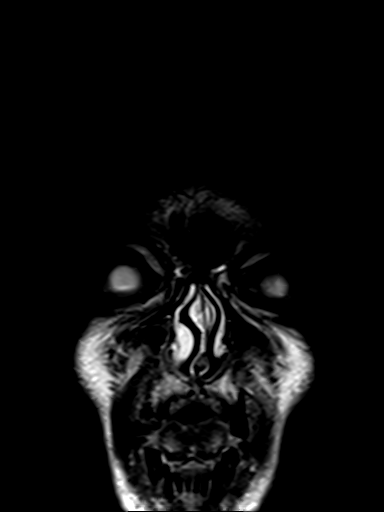

[Series 14: T1 post-contrast · axial · 1.0mm · 0.94mm/px · z∈[+18,+177]mm · 8 of 160 slices shown (1 of 3)]
[im 1/160]
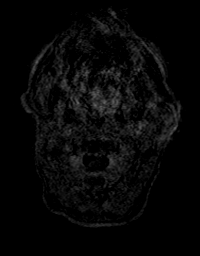
[im 23/160]
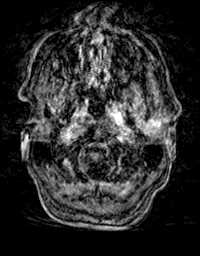
[im 46/160]
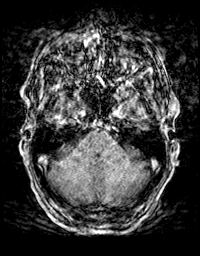
[im 69/160]
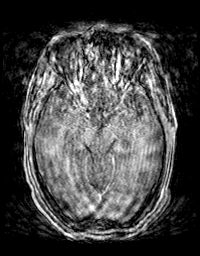
[im 91/160]
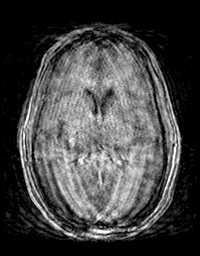
[im 114/160]
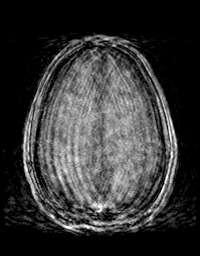
[im 137/160]
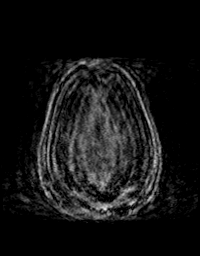
[im 160/160]
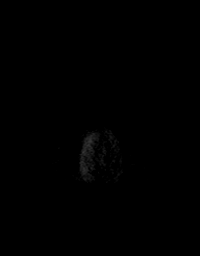

[Series 15: T1 post-contrast · coronal · 5.0mm · 0.43mm/px · 1 of 30 slices shown (2 of 3)]
[im 1/30]
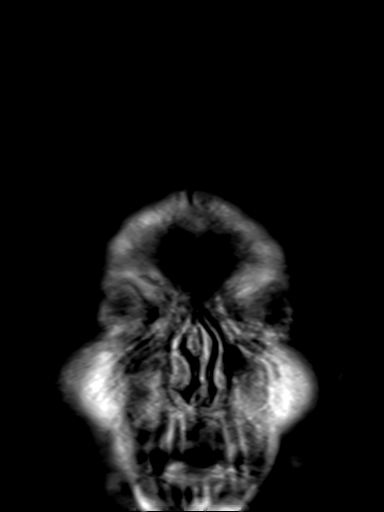

[Series 16: T1 post-contrast · axial · 1.0mm · 0.47mm/px · z∈[+18,+177]mm · 8 of 160 slices shown (3 of 3)]
[im 1/160]
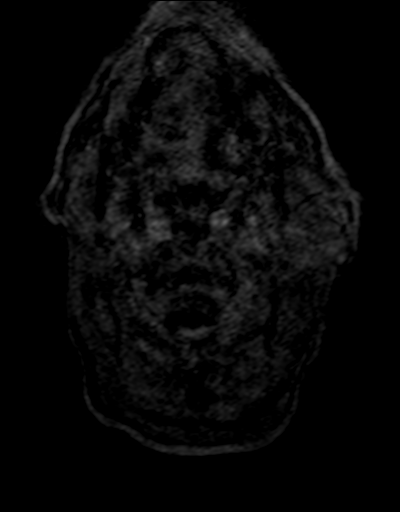
[im 23/160]
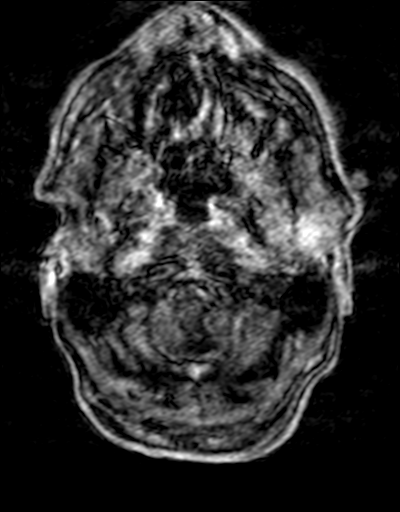
[im 46/160]
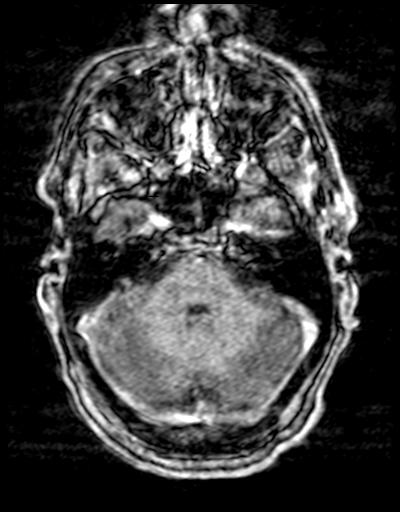
[im 69/160]
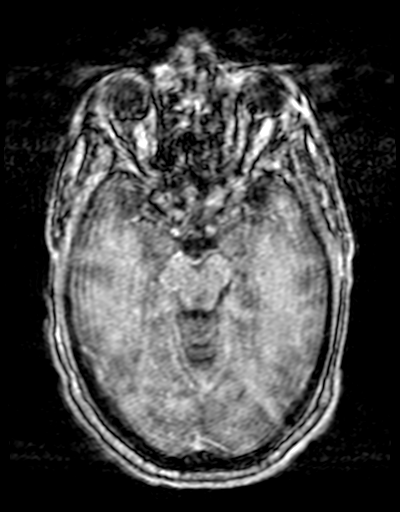
[im 91/160]
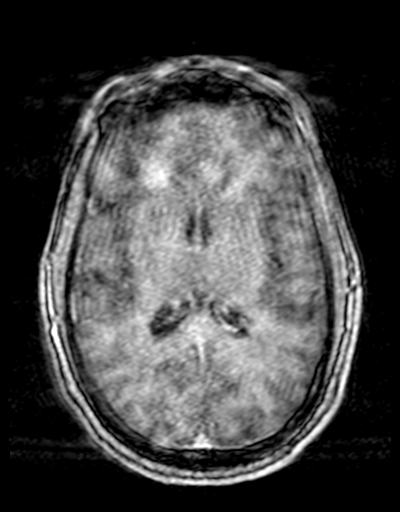
[im 114/160]
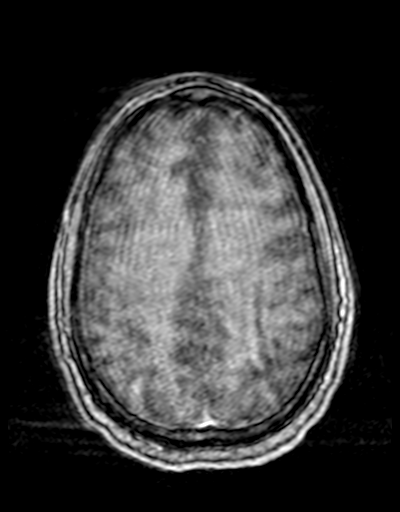
[im 137/160]
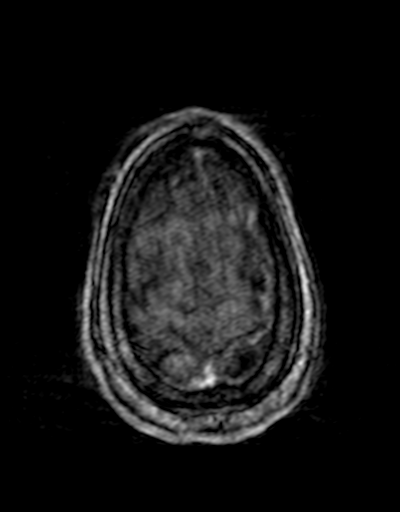
[im 160/160]
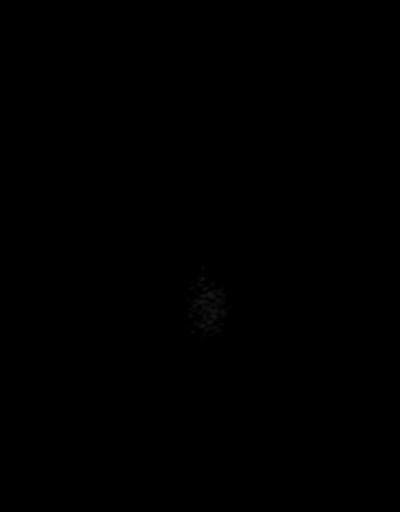

[43 of 48 positions shown; findings below may reference images not displayed]

FINDINGS: Brain: No acute infarction, hemorrhage, hydrocephalus, extra-axial
collection or mass lesion.

Normal white matter. Normal enhancement following contrast infusion.

Motion degraded study especially postcontrast images.

Vascular: Normal arterial flow voids.

Skull and upper cervical spine: No focal skeletal lesion

Sinuses/Orbits: Mild mucosal edema paranasal sinuses. Negative orbit

Large mass left parotid approximally 2.6 x 3 cm.

Other: None
IMPRESSION: Motion degraded study

Normal MRI of the brain with contrast

Left parotid mass.
